# Patient Record
Sex: Female | Born: 1945 | Race: White | Hispanic: No | Marital: Married | Smoking: Former smoker
Health system: Southern US, Community
[De-identification: ages and names within clinical notes are randomized; demographics above are authoritative.]

## PROBLEM LIST (undated history)

## (undated) DIAGNOSIS — M1990 Unspecified osteoarthritis, unspecified site: Secondary | ICD-10-CM

## (undated) DIAGNOSIS — R112 Nausea with vomiting, unspecified: Secondary | ICD-10-CM

## (undated) DIAGNOSIS — Z9889 Other specified postprocedural states: Secondary | ICD-10-CM

## (undated) HISTORY — PX: LAPAROSCOPIC ABDOMINAL EXPLORATION: SHX6249

## (undated) HISTORY — PX: ABDOMINAL HYSTERECTOMY: SHX81

---

## 2005-07-12 ENCOUNTER — Ambulatory Visit: Payer: Self-pay

## 2006-07-15 ENCOUNTER — Ambulatory Visit: Payer: Self-pay

## 2006-08-17 ENCOUNTER — Ambulatory Visit: Payer: Self-pay | Admitting: Family Medicine

## 2007-07-23 ENCOUNTER — Ambulatory Visit: Payer: Self-pay

## 2008-08-23 ENCOUNTER — Ambulatory Visit: Payer: Self-pay

## 2010-06-19 ENCOUNTER — Ambulatory Visit: Payer: Self-pay | Admitting: Obstetrics and Gynecology

## 2011-06-25 ENCOUNTER — Ambulatory Visit: Payer: Self-pay | Admitting: Obstetrics and Gynecology

## 2012-07-02 ENCOUNTER — Ambulatory Visit: Payer: Self-pay | Admitting: Nurse Practitioner

## 2013-07-07 ENCOUNTER — Ambulatory Visit: Payer: Self-pay | Admitting: Nurse Practitioner

## 2014-07-21 ENCOUNTER — Ambulatory Visit: Payer: Self-pay | Admitting: Nurse Practitioner

## 2016-06-20 ENCOUNTER — Other Ambulatory Visit: Payer: Self-pay | Admitting: Nurse Practitioner

## 2016-06-20 DIAGNOSIS — Z1231 Encounter for screening mammogram for malignant neoplasm of breast: Secondary | ICD-10-CM

## 2016-06-27 ENCOUNTER — Other Ambulatory Visit: Payer: Self-pay | Admitting: Nurse Practitioner

## 2016-06-27 ENCOUNTER — Ambulatory Visit
Admission: RE | Admit: 2016-06-27 | Discharge: 2016-06-27 | Disposition: A | Discharge status: Home / Self Care | Payer: Medicare Other | Source: Ambulatory Visit | Attending: Nurse Practitioner | Admitting: Nurse Practitioner

## 2016-06-27 DIAGNOSIS — Z1231 Encounter for screening mammogram for malignant neoplasm of breast: Secondary | ICD-10-CM

## 2017-10-27 IMAGING — MG MM DIGITAL SCREENING BILAT W/ TOMO W/ CAD
9 of 12 series · 9 of 28 positions shown · non-contrast
Comparison: Previous exam(s).

CLINICAL DATA: Screening.

EXAM:
2D DIGITAL SCREENING BILATERAL MAMMOGRAM WITH CAD AND ADJUNCT TOMO

[L CC]
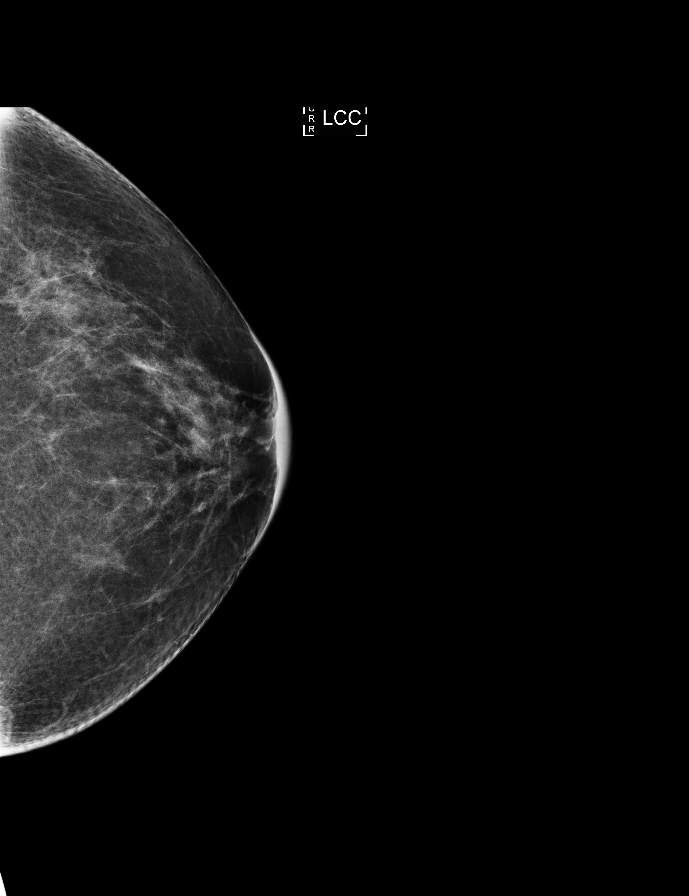

[R CC]
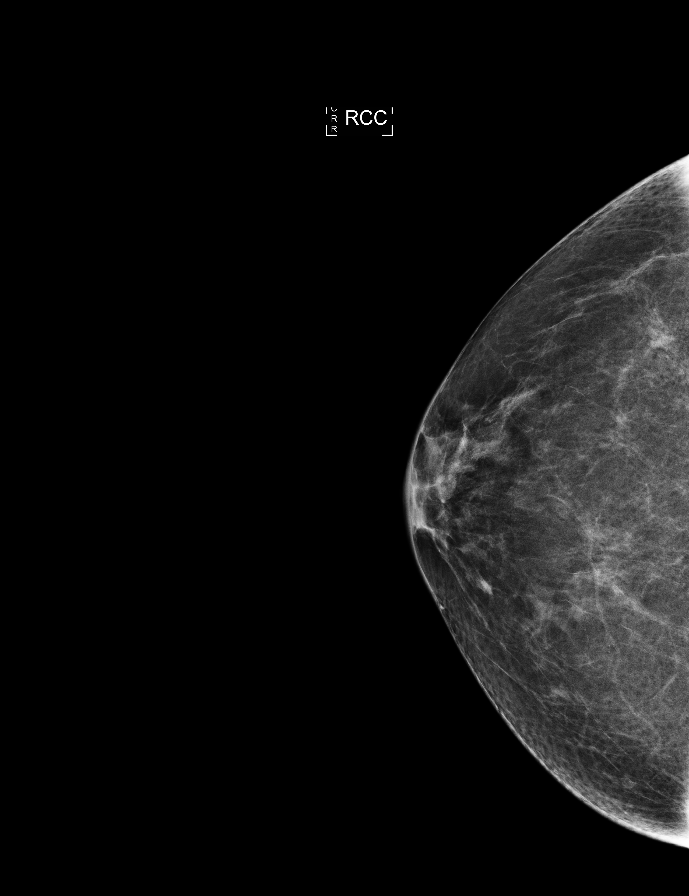

[R CC synth-2D]
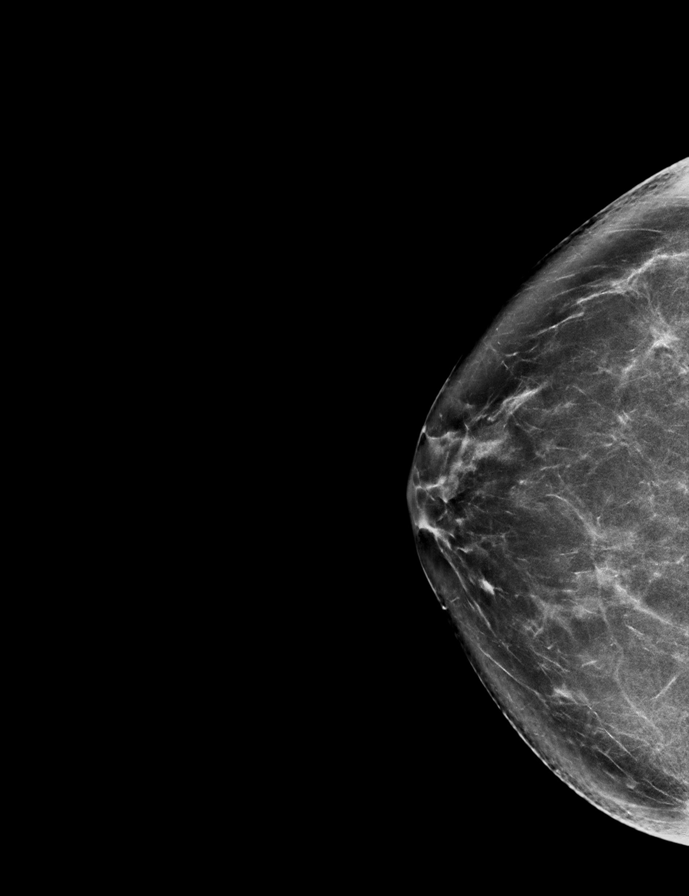

[L MLO]
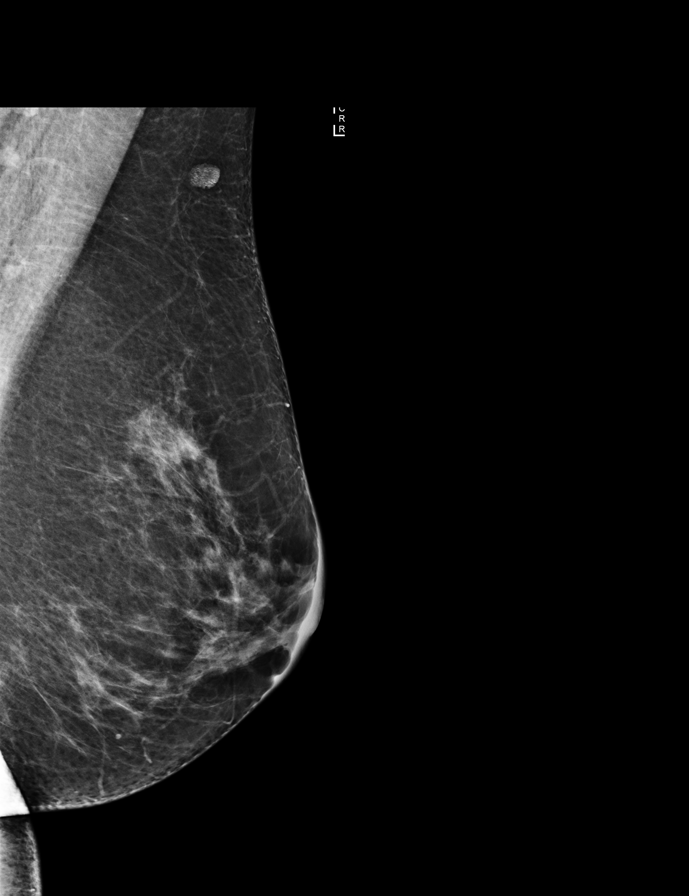

[L MLO synth-2D]
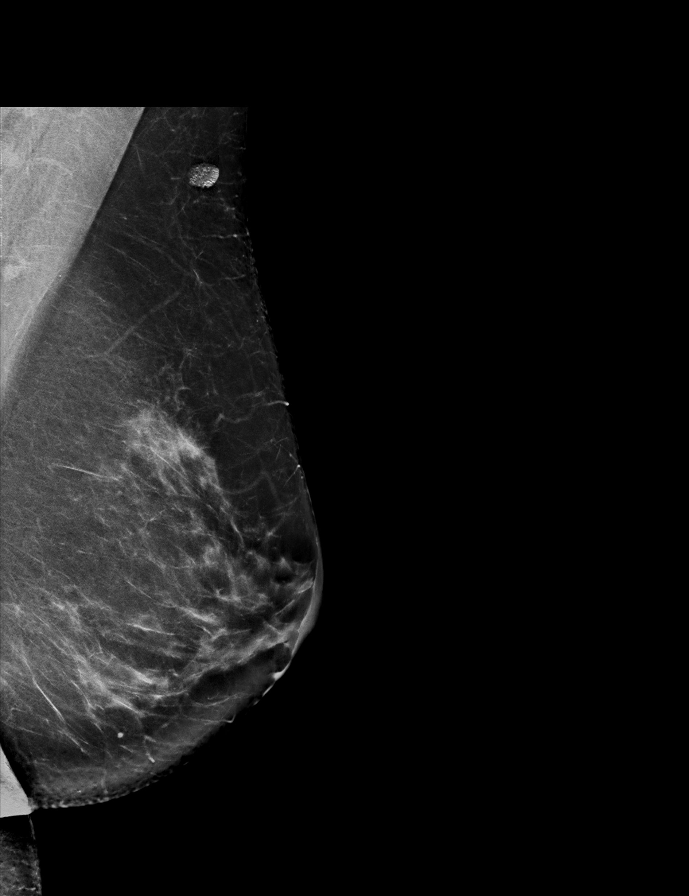

[R MLO]
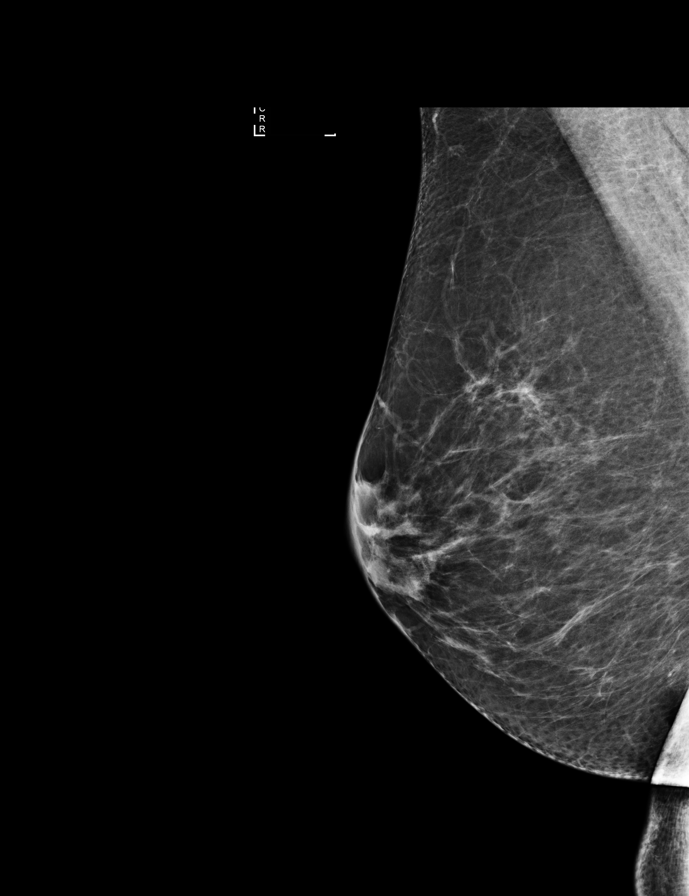

[L CC synth-2D]
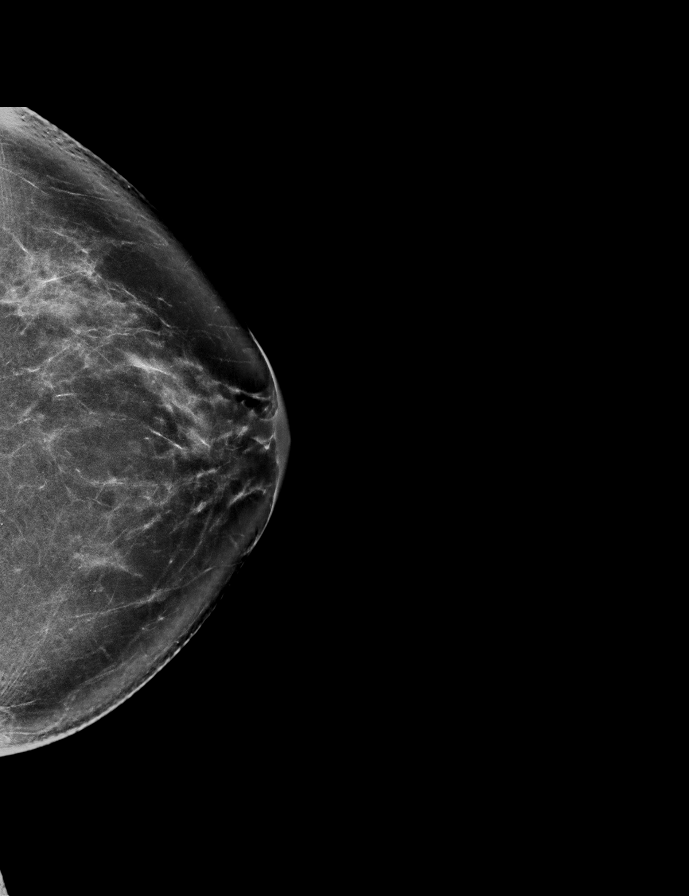

[R MLO synth-2D]
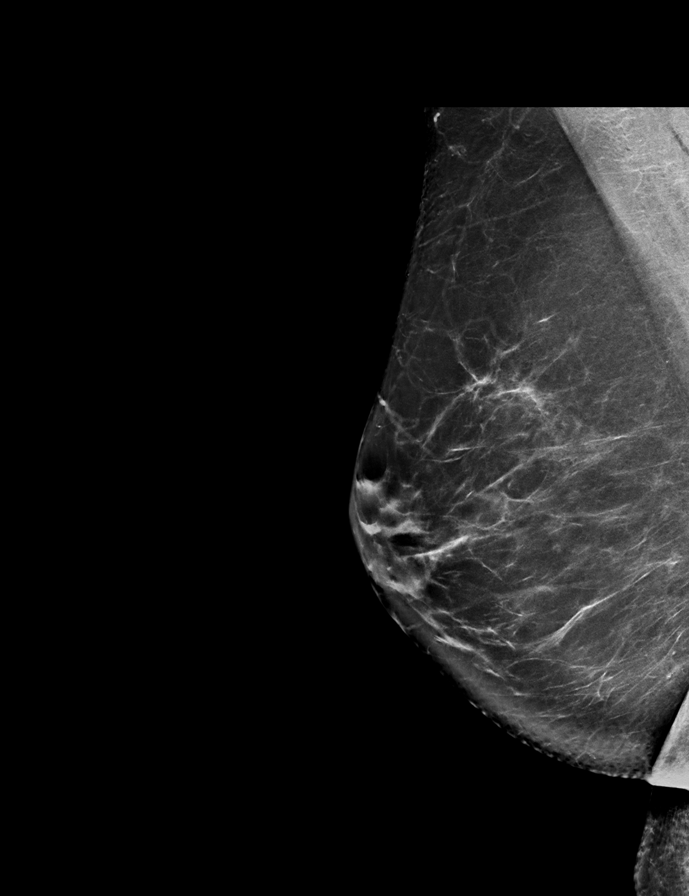

[L CC tomo · tomo slice 41/81.0]
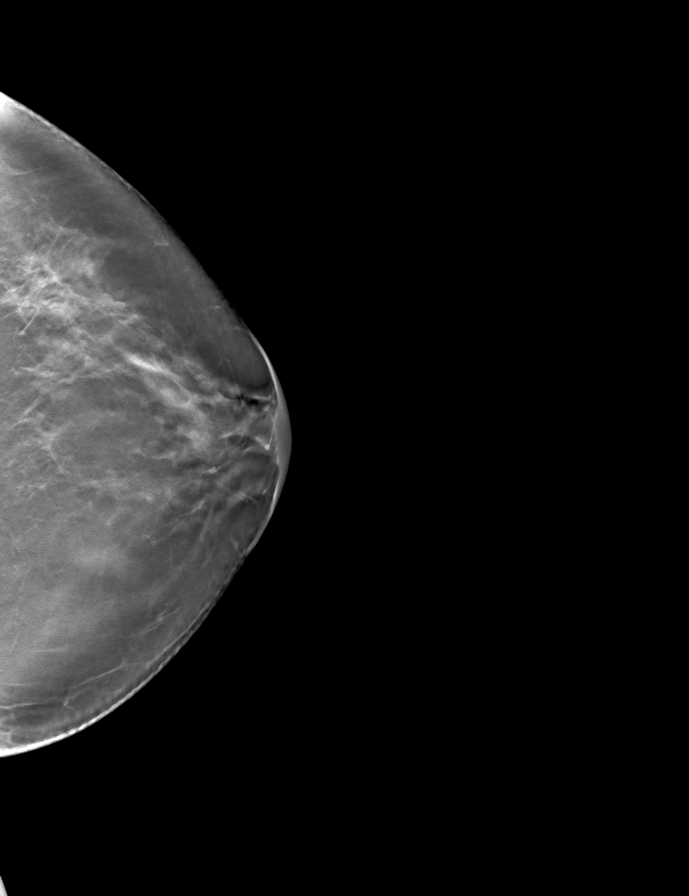

[9 of 28 positions shown; findings below may reference images not displayed]

ACR Breast Density Category b: There are scattered areas of
fibroglandular density.
FINDINGS: There are no findings suspicious for malignancy. Images were
processed with CAD.
IMPRESSION: No mammographic evidence of malignancy. A result letter of this
screening mammogram will be mailed directly to the patient.

RECOMMENDATION:
Screening mammogram in one year. (Code:97-6-RS4)

BI-RADS CATEGORY  1: Negative.

## 2018-04-23 ENCOUNTER — Encounter: Payer: Self-pay | Admitting: *Deleted

## 2018-04-23 ENCOUNTER — Other Ambulatory Visit: Payer: Self-pay

## 2018-04-30 NOTE — Discharge Instructions (Signed)

## 2018-05-05 ENCOUNTER — Ambulatory Visit
Admission: RE | Admit: 2018-05-05 | Discharge: 2018-05-05 | Disposition: A | Discharge status: Home / Self Care | Payer: Medicare Other | Source: Ambulatory Visit | Attending: Ophthalmology | Admitting: Ophthalmology

## 2018-05-05 ENCOUNTER — Encounter
Admission: RE | Disposition: A | Discharge status: Home / Self Care | Payer: Self-pay | Source: Ambulatory Visit | Attending: Ophthalmology

## 2018-05-05 ENCOUNTER — Ambulatory Visit: Payer: Medicare Other | Admitting: Anesthesiology

## 2018-05-05 DIAGNOSIS — I499 Cardiac arrhythmia, unspecified: Secondary | ICD-10-CM | POA: Insufficient documentation

## 2018-05-05 DIAGNOSIS — M1990 Unspecified osteoarthritis, unspecified site: Secondary | ICD-10-CM | POA: Diagnosis not present

## 2018-05-05 DIAGNOSIS — Z90710 Acquired absence of both cervix and uterus: Secondary | ICD-10-CM | POA: Insufficient documentation

## 2018-05-05 DIAGNOSIS — E7800 Pure hypercholesterolemia, unspecified: Secondary | ICD-10-CM | POA: Insufficient documentation

## 2018-05-05 DIAGNOSIS — Z79890 Hormone replacement therapy (postmenopausal): Secondary | ICD-10-CM | POA: Diagnosis not present

## 2018-05-05 DIAGNOSIS — R002 Palpitations: Secondary | ICD-10-CM | POA: Diagnosis not present

## 2018-05-05 DIAGNOSIS — H2511 Age-related nuclear cataract, right eye: Secondary | ICD-10-CM | POA: Diagnosis not present

## 2018-05-05 DIAGNOSIS — Z888 Allergy status to other drugs, medicaments and biological substances status: Secondary | ICD-10-CM | POA: Insufficient documentation

## 2018-05-05 HISTORY — DX: Other specified postprocedural states: Z98.890

## 2018-05-05 HISTORY — DX: Unspecified osteoarthritis, unspecified site: M19.90

## 2018-05-05 HISTORY — DX: Nausea with vomiting, unspecified: R11.2

## 2018-05-05 HISTORY — PX: CATARACT EXTRACTION W/PHACO: SHX586

## 2018-05-05 SURGERY — PHACOEMULSIFICATION, CATARACT, WITH IOL INSERTION
Anesthesia: Monitor Anesthesia Care | Site: Eye | Laterality: Bilateral | Wound class: "Clean "

## 2018-05-05 MED ORDER — MOXIFLOXACIN HCL 0.5 % OP SOLN
OPHTHALMIC | Status: DC | PRN
  Administered 2018-05-05: .1 mL via OPHTHALMIC

## 2018-05-05 MED ORDER — SODIUM HYALURONATE 10 MG/ML IO SOLN
INTRAOCULAR | Status: DC | PRN
  Administered 2018-05-05: 0.55 mL via INTRAOCULAR

## 2018-05-05 MED ORDER — MIDAZOLAM HCL 2 MG/2ML IJ SOLN
INTRAMUSCULAR | Status: DC | PRN
  Administered 2018-05-05: 2 mg via INTRAVENOUS

## 2018-05-05 MED ORDER — LACTATED RINGERS IV SOLN: 10.0000 mL/h | INTRAVENOUS | Status: DC

## 2018-05-05 MED ORDER — FENTANYL CITRATE (PF) 100 MCG/2ML IJ SOLN
INTRAMUSCULAR | Status: DC | PRN
  Administered 2018-05-05: 50 ug via INTRAVENOUS

## 2018-05-05 MED ORDER — PHENYLEPHRINE HCL 10 % OP SOLN
1.0000 [drp] | OPHTHALMIC | Status: DC | PRN
  Administered 2018-05-05 (×3): 1 [drp] via OPHTHALMIC

## 2018-05-05 MED ORDER — SODIUM HYALURONATE 23 MG/ML IO SOLN
INTRAOCULAR | Status: DC | PRN
  Administered 2018-05-05: 0.6 mL via INTRAOCULAR

## 2018-05-05 MED ORDER — CYCLOPENTOLATE HCL 2 % OP SOLN
1.0000 [drp] | OPHTHALMIC | Status: DC | PRN
  Administered 2018-05-05 (×3): 1 [drp] via OPHTHALMIC

## 2018-05-05 MED ORDER — LIDOCAINE HCL (PF) 2 % IJ SOLN
INTRAOCULAR | Status: DC | PRN
  Administered 2018-05-05: 1 mL via INTRAOCULAR

## 2018-05-05 MED ORDER — EPINEPHRINE PF 1 MG/ML IJ SOLN
INTRAOCULAR | Status: DC | PRN
  Administered 2018-05-05: 71 mL via OPHTHALMIC

## 2018-05-05 MED ORDER — ONDANSETRON HCL 4 MG/2ML IJ SOLN: 4.0000 mg | Freq: Once | INTRAMUSCULAR | Status: DC | PRN

## 2018-05-05 MED ORDER — TETRACAINE HCL 0.5 % OP SOLN
1.0000 [drp] | OPHTHALMIC | Status: DC | PRN
  Administered 2018-05-05 (×2): 1 [drp] via OPHTHALMIC

## 2018-05-05 SURGICAL SUPPLY — 17 items
CANNULA ANT/CHMB 27G (MISCELLANEOUS) ×1 IMPLANT
CANNULA ANT/CHMB 27GA (MISCELLANEOUS) ×3 IMPLANT
DISSECTOR HYDRO NUCLEUS 50X22 (MISCELLANEOUS) ×3 IMPLANT
GLOVE BIO SURGEON STRL SZ8 (GLOVE) ×3 IMPLANT
GLOVE SURG LX 7.5 STRW (GLOVE) ×2
GLOVE SURG LX STRL 7.5 STRW (GLOVE) ×1 IMPLANT
GOWN STRL REUS W/ TWL LRG LVL3 (GOWN DISPOSABLE) ×2 IMPLANT
GOWN STRL REUS W/TWL LRG LVL3 (GOWN DISPOSABLE) ×4
LENS IOL TECNIS ITEC 21.5 (Intraocular Lens) ×2 IMPLANT
MARKER SKIN DUAL TIP RULER LAB (MISCELLANEOUS) ×3 IMPLANT
PACK CATARACT (MISCELLANEOUS) ×3 IMPLANT
PACK DR. KING ARMS (PACKS) ×3 IMPLANT
PACK EYE AFTER SURG (MISCELLANEOUS) ×3 IMPLANT
SYR 3ML LL SCALE MARK (SYRINGE) ×3 IMPLANT
SYR TB 1ML LUER SLIP (SYRINGE) ×3 IMPLANT
WATER STERILE IRR 500ML POUR (IV SOLUTION) ×3 IMPLANT
WIPE NON LINTING 3.25X3.25 (MISCELLANEOUS) ×3 IMPLANT

## 2018-05-05 NOTE — Anesthesia Preprocedure Evaluation (Signed)
Anesthesia Evaluation  Patient identified by MRN, date of birth, ID band Patient awake    Reviewed: Allergy & Precautions, NPO status , Patient's Chart, lab work & pertinent test results  Airway Mallampati: II  TM Distance: >3 FB     Dental   Pulmonary former smoker,    breath sounds clear to auscultation       Cardiovascular Exercise Tolerance: Good negative cardio ROS   Rhythm:Regular Rate:Normal     Neuro/Psych negative neurological ROS     GI/Hepatic negative GI ROS,   Endo/Other  negative endocrine ROS  Renal/GU      Musculoskeletal   Abdominal   Peds  Hematology negative hematology ROS (+)   Anesthesia Other Findings   Reproductive/Obstetrics                             Anesthesia Physical Anesthesia Plan  ASA: I  Anesthesia Plan: MAC   Post-op Pain Management:    Induction:   PONV Risk Score and Plan:   Airway Management Planned: Nasal Cannula  Additional Equipment:   Intra-op Plan:   Post-operative Plan:   Informed Consent: I have reviewed the patients History and Physical, chart, labs and discussed the procedure including the risks, benefits and alternatives for the proposed anesthesia with the patient or authorized representative who has indicated his/her understanding and acceptance.     Plan Discussed with: CRNA  Anesthesia Plan Comments:         Anesthesia Quick Evaluation

## 2018-05-05 NOTE — Anesthesia Postprocedure Evaluation (Signed)
Anesthesia Post Note  Patient: Gabriella Wells  Procedure(s) Performed: CATARACT EXTRACTION PHACO AND INTRAOCULAR LENS PLACEMENT ([place])  RIGHT (Bilateral Eye)  Patient location during evaluation: PACU Anesthesia Type: MAC Level of consciousness: awake and alert Pain management: pain level controlled Vital Signs Assessment: post-procedure vital signs reviewed and stable Respiratory status: spontaneous breathing, nonlabored ventilation, respiratory function stable and patient connected to nasal cannula oxygen Cardiovascular status: stable and blood pressure returned to baseline Postop Assessment: no apparent nausea or vomiting Anesthetic complications: no    Veda Canning

## 2018-05-05 NOTE — H&P (Signed)
The History and Physical notes are on paper, have been signed, and are to be scanned.   I have examined the patient and there are no changes to the H&P.   Gabriella BladeBradley Li Wells 05/05/2018 8:19 AM

## 2018-05-05 NOTE — Anesthesia Procedure Notes (Signed)
Procedure Name: MAC Date/Time: 05/05/2018 8:29 AM Performed by: Janna Arch, CRNA Pre-anesthesia Checklist: Patient identified, Emergency Drugs available, Suction available and Patient being monitored Patient Re-evaluated:Patient Re-evaluated prior to induction Oxygen Delivery Method: Nasal cannula

## 2018-05-05 NOTE — Transfer of Care (Signed)
Immediate Anesthesia Transfer of Care Note  Patient: Gabriella Wells  Procedure(s) Performed: CATARACT EXTRACTION PHACO AND INTRAOCULAR LENS PLACEMENT ([facility])  RIGHT (Bilateral Eye)  Patient Location: PACU  Anesthesia Type: MAC  Level of Consciousness: awake, alert  and patient cooperative  Airway and Oxygen Therapy: Patient Spontanous Breathing and Patient connected to supplemental oxygen  Post-op Assessment: Post-op Vital signs reviewed, Patient's Cardiovascular Status Stable, Respiratory Function Stable, Patent Airway and No signs of Nausea or vomiting  Post-op Vital Signs: Reviewed and stable  Complications: No apparent anesthesia complications

## 2018-05-05 NOTE — Op Note (Signed)
OPERATIVE NOTE  Gabriella SosSandra Peacher Wells [id number] 05/05/2018   PREOPERATIVE DIAGNOSIS:  Nuclear sclerotic cataract right eye.  H25.11   POSTOPERATIVE DIAGNOSIS:    Nuclear sclerotic cataract right eye.     PROCEDURE:  Phacoemusification with posterior chamber intraocular lens placement of the right eye   LENS:   Implant Name Type Inv. Item Serial No. Manufacturer Lot No. LRB No. Used  LENS IOL DIOP 21.5 - [id number] Intraocular Lens LENS IOL DIOP 21.5 [id number] AMO  Right 1       PCB00 +21.5   ULTRASOUND TIME: 0 minutes 29 seconds.  CDE 3.55   SURGEON:  Willey BladeBradley Crystallynn Noorani, MD, MPH  ANESTHESIOLOGIST: Anesthesiologist: Jola BabinskiHarker, Elsje, MD CRNA: Maryan RuedWilson, Jennifer M, CRNA   ANESTHESIA:  Topical with tetracaine drops augmented with 1% preservative-free intracameral lidocaine.  ESTIMATED BLOOD LOSS: less than 1 mL.   COMPLICATIONS:  None.   DESCRIPTION OF PROCEDURE:  The patient was identified in the holding room and transported to the operating room and placed in the supine position under the operating microscope.  The right eye was identified as the operative eye and it was prepped and draped in the usual sterile ophthalmic fashion.   A 1.0 millimeter clear-corneal paracentesis was made at the 10:30 position. 0.5 ml of preservative-free 1% lidocaine with epinephrine was injected into the anterior chamber.  The anterior chamber was filled with Healon 5 viscoelastic.  A 2.4 millimeter keratome was used to make a near-clear corneal incision at the 8:00 position.  A curvilinear capsulorrhexis was made with a cystotome and capsulorrhexis forceps.  Balanced salt solution was used to hydrodissect and hydrodelineate the nucleus.   Phacoemulsification was then used in stop and chop fashion to remove the lens nucleus and epinucleus.  The remaining cortex was then removed using the irrigation and aspiration handpiece. Healon was then placed into the capsular bag to distend it for lens placement.  A lens  was then injected into the capsular bag.  The remaining viscoelastic was aspirated.   Wounds were hydrated with balanced salt solution.  The anterior chamber was inflated to a physiologic pressure with balanced salt solution.   Intracameral vigamox 0.1 mL undiluted was injected into the eye and a drop placed onto the ocular surface.  No wound leaks were noted.  The patient was taken to the recovery room in stable condition without complications of anesthesia or surgery  [name] 05/05/2018, 8:54 AM

## 2018-06-03 ENCOUNTER — Other Ambulatory Visit: Payer: Self-pay

## 2018-06-03 ENCOUNTER — Encounter: Payer: Self-pay | Admitting: *Deleted

## 2018-06-05 NOTE — Discharge Instructions (Signed)

## 2018-06-09 ENCOUNTER — Ambulatory Visit
Admission: RE | Admit: 2018-06-09 | Discharge: 2018-06-09 | Disposition: A | Discharge status: Home / Self Care | Payer: Medicare Other | Source: Ambulatory Visit | Attending: Ophthalmology | Admitting: Ophthalmology

## 2018-06-09 ENCOUNTER — Ambulatory Visit: Payer: Medicare Other | Admitting: Anesthesiology

## 2018-06-09 ENCOUNTER — Encounter
Admission: RE | Disposition: A | Discharge status: Home / Self Care | Payer: Self-pay | Source: Ambulatory Visit | Attending: Ophthalmology

## 2018-06-09 DIAGNOSIS — Z79899 Other long term (current) drug therapy: Secondary | ICD-10-CM | POA: Insufficient documentation

## 2018-06-09 DIAGNOSIS — Z85828 Personal history of other malignant neoplasm of skin: Secondary | ICD-10-CM | POA: Diagnosis not present

## 2018-06-09 DIAGNOSIS — Z888 Allergy status to other drugs, medicaments and biological substances status: Secondary | ICD-10-CM | POA: Insufficient documentation

## 2018-06-09 DIAGNOSIS — E7800 Pure hypercholesterolemia, unspecified: Secondary | ICD-10-CM | POA: Insufficient documentation

## 2018-06-09 DIAGNOSIS — H2512 Age-related nuclear cataract, left eye: Secondary | ICD-10-CM | POA: Insufficient documentation

## 2018-06-09 DIAGNOSIS — Z87891 Personal history of nicotine dependence: Secondary | ICD-10-CM | POA: Insufficient documentation

## 2018-06-09 DIAGNOSIS — M1990 Unspecified osteoarthritis, unspecified site: Secondary | ICD-10-CM | POA: Diagnosis not present

## 2018-06-09 HISTORY — PX: CATARACT EXTRACTION W/PHACO: SHX586

## 2018-06-09 SURGERY — PHACOEMULSIFICATION, CATARACT, WITH IOL INSERTION
Anesthesia: Monitor Anesthesia Care | Site: Eye | Laterality: Left | Wound class: "Clean "

## 2018-06-09 MED ORDER — FENTANYL CITRATE (PF) 100 MCG/2ML IJ SOLN
INTRAMUSCULAR | Status: DC | PRN
  Administered 2018-06-09: 50 ug via INTRAVENOUS

## 2018-06-09 MED ORDER — MIDAZOLAM HCL 2 MG/2ML IJ SOLN
INTRAMUSCULAR | Status: DC | PRN
  Administered 2018-06-09: 2 mg via INTRAVENOUS

## 2018-06-09 MED ORDER — LIDOCAINE HCL (PF) 2 % IJ SOLN
INTRAOCULAR | Status: DC | PRN
  Administered 2018-06-09: 1 mL via INTRAOCULAR

## 2018-06-09 MED ORDER — ARMC OPHTHALMIC DILATING DROPS
1.0000 "application " | OPHTHALMIC | Status: DC | PRN
  Administered 2018-06-09 (×3): 1 via OPHTHALMIC

## 2018-06-09 MED ORDER — SODIUM HYALURONATE 10 MG/ML IO SOLN
INTRAOCULAR | Status: DC | PRN
  Administered 2018-06-09: 0.55 mL via INTRAOCULAR

## 2018-06-09 MED ORDER — EPINEPHRINE PF 1 MG/ML IJ SOLN
INTRAOCULAR | Status: DC | PRN
  Administered 2018-06-09: 10:00:00 via OPHTHALMIC

## 2018-06-09 MED ORDER — LACTATED RINGERS IV SOLN: INTRAVENOUS | Status: DC

## 2018-06-09 MED ORDER — MOXIFLOXACIN HCL 0.5 % OP SOLN
OPHTHALMIC | Status: DC | PRN
  Administered 2018-06-09: 0.2 mL

## 2018-06-09 MED ORDER — SODIUM HYALURONATE 23 MG/ML IO SOLN
INTRAOCULAR | Status: DC | PRN
  Administered 2018-06-09: 0.6 mL via INTRAOCULAR

## 2018-06-09 SURGICAL SUPPLY — 17 items
CANNULA ANT/CHMB 27G (MISCELLANEOUS) ×1 IMPLANT
CANNULA ANT/CHMB 27GA (MISCELLANEOUS) ×2 IMPLANT
DISSECTOR HYDRO NUCLEUS 50X22 (MISCELLANEOUS) ×2 IMPLANT
GLOVE BIO SURGEON STRL SZ8 (GLOVE) ×2 IMPLANT
GLOVE SURG LX 7.5 STRW (GLOVE) ×2
GLOVE SURG LX STRL 7.5 STRW (GLOVE) ×1 IMPLANT
GOWN STRL REUS W/ TWL LRG LVL3 (GOWN DISPOSABLE) ×2 IMPLANT
GOWN STRL REUS W/TWL LRG LVL3 (GOWN DISPOSABLE) ×2
LENS IOL TECNIS ITEC 22.0 (Intraocular Lens) ×1 IMPLANT
MARKER SKIN DUAL TIP RULER LAB (MISCELLANEOUS) ×2 IMPLANT
PACK CATARACT (MISCELLANEOUS) ×2 IMPLANT
PACK DR. KING ARMS (PACKS) ×2 IMPLANT
PACK EYE AFTER SURG (MISCELLANEOUS) ×2 IMPLANT
SYR 3ML LL SCALE MARK (SYRINGE) ×2 IMPLANT
SYR TB 1ML LUER SLIP (SYRINGE) ×2 IMPLANT
WATER STERILE IRR 500ML POUR (IV SOLUTION) ×2 IMPLANT
WIPE NON LINTING 3.25X3.25 (MISCELLANEOUS) ×2 IMPLANT

## 2018-06-09 NOTE — Transfer of Care (Signed)
Immediate Anesthesia Transfer of Care Note  Patient: Gabriella Wells  Procedure(s) Performed: CATARACT EXTRACTION PHACO AND INTRAOCULAR LENS PLACEMENT ([facility])  LEFT (Left Eye)  Patient Location: PACU  Anesthesia Type: MAC  Level of Consciousness: awake, alert  and patient cooperative  Airway and Oxygen Therapy: Patient Spontanous Breathing and Patient connected to supplemental oxygen  Post-op Assessment: Post-op Vital signs reviewed, Patient's Cardiovascular Status Stable, Respiratory Function Stable, Patent Airway and No signs of Nausea or vomiting  Post-op Vital Signs: Reviewed and stable  Complications: No apparent anesthesia complications

## 2018-06-09 NOTE — Anesthesia Preprocedure Evaluation (Addendum)
Anesthesia Evaluation  Patient identified by MRN, date of birth, ID band Patient awake    Reviewed: Allergy & Precautions, NPO status , Patient's Chart, lab work & pertinent test results, reviewed documented beta blocker date and time   History of Anesthesia Complications (+) PONV  Airway Mallampati: II  TM Distance: >3 FB Neck ROM: Full    Dental no notable dental hx.    Pulmonary former smoker,    Pulmonary exam normal breath sounds clear to auscultation       Cardiovascular negative cardio ROS Normal cardiovascular exam Rhythm:Regular Rate:Normal     Neuro/Psych negative neurological ROS  negative psych ROS   GI/Hepatic negative GI ROS, Neg liver ROS,   Endo/Other  negative endocrine ROS  Renal/GU negative Renal ROS     Musculoskeletal   Abdominal Normal abdominal exam  (+)  Abdomen: soft.    Peds  Hematology [facility]   Anesthesia Other Findings   Reproductive/Obstetrics Endometriosis                            Anesthesia Physical Anesthesia Plan  ASA: II  Anesthesia Plan: MAC   Post-op Pain Management:    Induction:   PONV Risk Score and Plan:   Airway Management Planned: Nasal Cannula  Additional Equipment: None  Intra-op Plan:   Post-operative Plan:   Informed Consent: I have reviewed the patients History and Physical, chart, labs and discussed the procedure including the risks, benefits and alternatives for the proposed anesthesia with the patient or authorized representative who has indicated his/her understanding and acceptance.     Plan Discussed with: CRNA, Anesthesiologist and Surgeon  Anesthesia Plan Comments:         Anesthesia Quick Evaluation

## 2018-06-09 NOTE — Anesthesia Postprocedure Evaluation (Signed)
Anesthesia Post Note  Patient: Gabriella Wells  Procedure(s) Performed: CATARACT EXTRACTION PHACO AND INTRAOCULAR LENS PLACEMENT ([place])  LEFT (Left Eye)  Patient location during evaluation: PACU Anesthesia Type: MAC Level of consciousness: awake Pain management: pain level controlled Vital Signs Assessment: post-procedure vital signs reviewed and stable Respiratory status: spontaneous breathing Cardiovascular status: blood pressure returned to baseline Postop Assessment: no headache Anesthetic complications: no    Lavonna Monarch

## 2018-06-09 NOTE — H&P (Signed)
The History and Physical notes are on paper, have been signed, and are to be scanned.   I have examined the patient and there are no changes to the H&P.   Willey Blade 06/09/2018 9:26 AM

## 2018-06-09 NOTE — Anesthesia Procedure Notes (Signed)
Procedure Name: MAC Performed by: Sevana Grandinetti, CRNA Pre-anesthesia Checklist: Patient identified, Emergency Drugs available, Suction available, Timeout performed and Patient being monitored Patient Re-evaluated:Patient Re-evaluated prior to induction Oxygen Delivery Method: Nasal cannula Placement Confirmation: positive ETCO2       

## 2018-06-09 NOTE — Op Note (Signed)
OPERATIVE NOTE  Gabriella Wells [id number] 06/09/2018   PREOPERATIVE DIAGNOSIS:  Nuclear sclerotic cataract left eye.  H25.12   POSTOPERATIVE DIAGNOSIS:    Nuclear sclerotic cataract left eye.     PROCEDURE:  Phacoemusification with posterior chamber intraocular lens placement of the left eye   LENS:   Implant Name Type Inv. Item Serial No. Manufacturer Lot No. LRB No. Used  LENS IOL DIOP 22.0 - [id number] Intraocular Lens LENS IOL DIOP 22.0 [id number] AMO  Left 1       PCB00 +22.0   ULTRASOUND TIME: 0 minutes 25 seconds.  CDE 2.52   SURGEON:  Willey Blade, MD, MPH   ANESTHESIA:  Topical with tetracaine drops augmented with 1% preservative-free intracameral lidocaine.  ESTIMATED BLOOD LOSS: <1 mL   COMPLICATIONS:  None.   DESCRIPTION OF PROCEDURE:  The patient was identified in the holding room and transported to the operating room and placed in the supine position under the operating microscope.  The left eye was identified as the operative eye and it was prepped and draped in the usual sterile ophthalmic fashion.   A 1.0 millimeter clear-corneal paracentesis was made at the 5:00 position. 0.5 ml of preservative-free 1% lidocaine with epinephrine was injected into the anterior chamber.  The anterior chamber was filled with Healon 5 viscoelastic.  A 2.4 millimeter keratome was used to make a near-clear corneal incision at the 2:00 position.  A curvilinear capsulorrhexis was made with a cystotome and capsulorrhexis forceps.  Balanced salt solution was used to hydrodissect and hydrodelineate the nucleus.   Phacoemulsification was then used in stop and chop fashion to remove the lens nucleus and epinucleus.  The remaining cortex was then removed using the irrigation and aspiration handpiece. Healon was then placed into the capsular bag to distend it for lens placement.  A lens was then injected into the capsular bag.  The remaining viscoelastic was aspirated.   Wounds were  hydrated with balanced salt solution.  The anterior chamber was inflated to a physiologic pressure with balanced salt solution.  Intracameral vigamox 0.1 mL undiltued was injected into the eye and a drop placed onto the ocular surface.  No wound leaks were noted.  The patient was taken to the recovery room in stable condition without complications of anesthesia or surgery  Willey Blade 06/09/2018, 10:02 AM

## 2018-06-11 ENCOUNTER — Encounter: Payer: Self-pay | Admitting: Ophthalmology

## 2019-06-26 ENCOUNTER — Other Ambulatory Visit: Payer: Self-pay | Admitting: Nurse Practitioner

## 2019-06-26 DIAGNOSIS — Z1231 Encounter for screening mammogram for malignant neoplasm of breast: Secondary | ICD-10-CM

## 2019-07-08 ENCOUNTER — Other Ambulatory Visit: Payer: Self-pay

## 2019-07-08 ENCOUNTER — Ambulatory Visit
Admission: RE | Admit: 2019-07-08 | Discharge: 2019-07-08 | Disposition: A | Discharge status: Home / Self Care | Payer: Medicare Other | Source: Ambulatory Visit | Attending: Nurse Practitioner | Admitting: Nurse Practitioner

## 2019-07-08 DIAGNOSIS — Z1231 Encounter for screening mammogram for malignant neoplasm of breast: Secondary | ICD-10-CM | POA: Insufficient documentation

## 2020-07-01 ENCOUNTER — Ambulatory Visit: Payer: Medicare Other | Attending: Internal Medicine

## 2020-07-01 ENCOUNTER — Other Ambulatory Visit: Payer: Self-pay | Admitting: Internal Medicine

## 2020-07-01 DIAGNOSIS — Z23 Encounter for immunization: Secondary | ICD-10-CM

## 2020-07-01 NOTE — Progress Notes (Signed)
   Covid-19 Vaccination Clinic  Name:  Gabriella Wells    MRN: [id number] DOB: 1946-09-04  07/01/2020  Gabriella Wells was observed post Covid-19 immunization for 15 minutes without incident. She was provided with Vaccine Information Sheet and instruction to access the V-Safe system.   Gabriella Wells was instructed to call 911 with any severe reactions post vaccine: Marland Kitchen Difficulty breathing  . Swelling of face and throat  . A fast heartbeat  . A bad rash all over body  . Dizziness and weakness

## 2021-07-05 ENCOUNTER — Other Ambulatory Visit: Payer: Self-pay | Admitting: Nurse Practitioner

## 2021-07-05 DIAGNOSIS — Z1231 Encounter for screening mammogram for malignant neoplasm of breast: Secondary | ICD-10-CM

## 2021-08-01 ENCOUNTER — Other Ambulatory Visit: Payer: Self-pay

## 2021-08-01 ENCOUNTER — Ambulatory Visit
Admission: RE | Admit: 2021-08-01 | Discharge: 2021-08-01 | Disposition: A | Discharge status: Home / Self Care | Payer: Medicare Other | Source: Ambulatory Visit | Attending: Nurse Practitioner | Admitting: Nurse Practitioner

## 2021-08-01 DIAGNOSIS — Z1231 Encounter for screening mammogram for malignant neoplasm of breast: Secondary | ICD-10-CM | POA: Insufficient documentation
